# Patient Record
Sex: Male | Born: 1955
Health system: Southern US, Community
[De-identification: ages and names within clinical notes are randomized; demographics above are authoritative.]

## PROBLEM LIST (undated history)

## (undated) DIAGNOSIS — I10 Essential (primary) hypertension: Secondary | ICD-10-CM

## (undated) DIAGNOSIS — N529 Male erectile dysfunction, unspecified: Secondary | ICD-10-CM

## (undated) DIAGNOSIS — R5383 Other fatigue: Secondary | ICD-10-CM

## (undated) DIAGNOSIS — N189 Chronic kidney disease, unspecified: Secondary | ICD-10-CM

## (undated) DIAGNOSIS — K219 Gastro-esophageal reflux disease without esophagitis: Secondary | ICD-10-CM

## (undated) DIAGNOSIS — G4730 Sleep apnea, unspecified: Secondary | ICD-10-CM

## (undated) DIAGNOSIS — K635 Polyp of colon: Secondary | ICD-10-CM

## (undated) HISTORY — DX: Essential (primary) hypertension: I10

## (undated) HISTORY — DX: Polyp of colon: K63.5

## (undated) HISTORY — DX: Gastro-esophageal reflux disease without esophagitis: K21.9

## (undated) HISTORY — PX: TONSILLECTOMY: SUR1361

## (undated) HISTORY — PX: HERNIA REPAIR: SHX51

## (undated) HISTORY — DX: Male erectile dysfunction, unspecified: N52.9

## (undated) HISTORY — DX: Other fatigue: R53.83

## (undated) HISTORY — DX: Chronic kidney disease, unspecified: N18.9

---

## 2007-12-11 DIAGNOSIS — K635 Polyp of colon: Secondary | ICD-10-CM

## 2007-12-11 HISTORY — DX: Polyp of colon: K63.5

## 2011-02-09 ENCOUNTER — Ambulatory Visit (HOSPITAL_COMMUNITY)
Admission: RE | Admit: 2011-02-09 | Discharge: 2011-02-09 | Disposition: A | Discharge status: Home / Self Care | Payer: BC Managed Care – PPO | Source: Ambulatory Visit | Attending: Oncology | Admitting: Oncology

## 2011-02-09 ENCOUNTER — Other Ambulatory Visit: Payer: Self-pay | Admitting: Oncology

## 2011-02-09 ENCOUNTER — Encounter (HOSPITAL_BASED_OUTPATIENT_CLINIC_OR_DEPARTMENT_OTHER): Payer: BC Managed Care – PPO | Admitting: Oncology

## 2011-02-09 DIAGNOSIS — D696 Thrombocytopenia, unspecified: Secondary | ICD-10-CM | POA: Insufficient documentation

## 2011-02-09 DIAGNOSIS — R109 Unspecified abdominal pain: Secondary | ICD-10-CM | POA: Insufficient documentation

## 2011-02-09 DIAGNOSIS — D6959 Other secondary thrombocytopenia: Secondary | ICD-10-CM

## 2011-02-09 DIAGNOSIS — R143 Flatulence: Secondary | ICD-10-CM | POA: Insufficient documentation

## 2011-02-09 DIAGNOSIS — R141 Gas pain: Secondary | ICD-10-CM | POA: Insufficient documentation

## 2011-02-09 DIAGNOSIS — R142 Eructation: Secondary | ICD-10-CM | POA: Insufficient documentation

## 2011-02-09 LAB — CBC WITH DIFFERENTIAL/PLATELET
BASO%: 0.4 % (ref 0.0–2.0)
EOS%: 1.1 % (ref 0.0–7.0)
HGB: 14.7 g/dL (ref 13.0–17.1)
MCH: 30.6 pg (ref 27.2–33.4)
MCHC: 35.6 g/dL (ref 32.0–36.0)
MCV: 85.9 fL (ref 79.3–98.0)
MONO%: 7.3 % (ref 0.0–14.0)
RBC: 4.81 10*6/uL (ref 4.20–5.82)
RDW: 12.7 % (ref 11.0–14.6)
lymph#: 1.5 10*3/uL (ref 0.9–3.3)

## 2011-02-09 LAB — PROTIME-INR
INR: 1 — ABNORMAL LOW (ref 2.00–3.50)
Protime: 12 Seconds (ref 10.6–13.4)

## 2011-02-09 LAB — CHCC SMEAR

## 2011-02-10 LAB — COMPREHENSIVE METABOLIC PANEL
AST: 32 U/L (ref 0–37)
Albumin: 4.8 g/dL (ref 3.5–5.2)
Alkaline Phosphatase: 54 U/L (ref 39–117)
Glucose, Bld: 68 mg/dL — ABNORMAL LOW (ref 70–99)
Potassium: 4.4 mEq/L (ref 3.5–5.3)
Sodium: 140 mEq/L (ref 135–145)
Total Bilirubin: 0.7 mg/dL (ref 0.3–1.2)
Total Protein: 6.7 g/dL (ref 6.0–8.3)

## 2011-04-18 ENCOUNTER — Other Ambulatory Visit: Payer: Self-pay | Admitting: Oncology

## 2011-04-18 ENCOUNTER — Encounter (HOSPITAL_BASED_OUTPATIENT_CLINIC_OR_DEPARTMENT_OTHER): Payer: BC Managed Care – PPO | Admitting: Oncology

## 2011-04-18 DIAGNOSIS — R109 Unspecified abdominal pain: Secondary | ICD-10-CM

## 2011-04-18 DIAGNOSIS — D696 Thrombocytopenia, unspecified: Secondary | ICD-10-CM

## 2011-04-18 DIAGNOSIS — D6959 Other secondary thrombocytopenia: Secondary | ICD-10-CM

## 2011-04-18 LAB — CBC WITH DIFFERENTIAL/PLATELET
Eosinophils Absolute: 0.1 10*3/uL (ref 0.0–0.5)
HCT: 41.6 % (ref 38.4–49.9)
LYMPH%: 26.9 % (ref 14.0–49.0)
MCHC: 34.7 g/dL (ref 32.0–36.0)
MCV: 89.1 fL (ref 79.3–98.0)
MONO#: 0.4 10*3/uL (ref 0.1–0.9)
MONO%: 8 % (ref 0.0–14.0)
NEUT#: 3 10*3/uL (ref 1.5–6.5)
NEUT%: 63 % (ref 39.0–75.0)
Platelets: 124 10*3/uL — ABNORMAL LOW (ref 140–400)
RBC: 4.66 10*6/uL (ref 4.20–5.82)
WBC: 4.7 10*3/uL (ref 4.0–10.3)

## 2011-04-23 ENCOUNTER — Ambulatory Visit (HOSPITAL_COMMUNITY)
Admission: RE | Admit: 2011-04-23 | Discharge: 2011-04-23 | Disposition: A | Discharge status: Home / Self Care | Payer: BC Managed Care – PPO | Source: Ambulatory Visit | Attending: Oncology | Admitting: Oncology

## 2011-04-23 DIAGNOSIS — K7689 Other specified diseases of liver: Secondary | ICD-10-CM | POA: Insufficient documentation

## 2011-04-23 DIAGNOSIS — R109 Unspecified abdominal pain: Secondary | ICD-10-CM | POA: Insufficient documentation

## 2011-04-23 DIAGNOSIS — K4020 Bilateral inguinal hernia, without obstruction or gangrene, not specified as recurrent: Secondary | ICD-10-CM | POA: Insufficient documentation

## 2011-04-23 DIAGNOSIS — D696 Thrombocytopenia, unspecified: Secondary | ICD-10-CM

## 2011-04-23 MED ORDER — IOHEXOL 300 MG/ML  SOLN
100.0000 mL | Freq: Once | INTRAMUSCULAR | Status: AC | PRN
  Administered 2011-04-23: 100 mL via INTRAVENOUS

## 2011-09-19 ENCOUNTER — Encounter: Payer: Self-pay | Admitting: *Deleted

## 2011-10-17 ENCOUNTER — Encounter: Payer: Self-pay | Admitting: *Deleted

## 2011-10-27 ENCOUNTER — Telehealth: Payer: Self-pay | Admitting: Oncology

## 2011-10-27 NOTE — Telephone Encounter (Signed)
Lvm advising new appt 12/21/11 @ 10am r/s'd from 11/9 appt cx'd due to Epic. I have also mailed pt an appt calendar.

## 2011-12-21 ENCOUNTER — Ambulatory Visit: Payer: BC Managed Care – PPO | Admitting: Oncology

## 2011-12-21 ENCOUNTER — Other Ambulatory Visit: Payer: BC Managed Care – PPO

## 2012-11-17 ENCOUNTER — Emergency Department (INDEPENDENT_AMBULATORY_CARE_PROVIDER_SITE_OTHER): Payer: BC Managed Care – PPO

## 2012-11-17 ENCOUNTER — Emergency Department
Admission: EM | Admit: 2012-11-17 | Discharge: 2012-11-17 | Disposition: A | Discharge status: Home / Self Care | Payer: BC Managed Care – PPO | Source: Home / Self Care | Attending: Family Medicine | Admitting: Family Medicine

## 2012-11-17 ENCOUNTER — Encounter: Payer: Self-pay | Admitting: *Deleted

## 2012-11-17 DIAGNOSIS — R509 Fever, unspecified: Secondary | ICD-10-CM

## 2012-11-17 DIAGNOSIS — J029 Acute pharyngitis, unspecified: Secondary | ICD-10-CM

## 2012-11-17 DIAGNOSIS — R059 Cough, unspecified: Secondary | ICD-10-CM

## 2012-11-17 DIAGNOSIS — J069 Acute upper respiratory infection, unspecified: Secondary | ICD-10-CM

## 2012-11-17 DIAGNOSIS — R0602 Shortness of breath: Secondary | ICD-10-CM

## 2012-11-17 DIAGNOSIS — R05 Cough: Secondary | ICD-10-CM

## 2012-11-17 HISTORY — DX: Sleep apnea, unspecified: G47.30

## 2012-11-17 MED ORDER — AZITHROMYCIN 250 MG PO TABS: ORAL_TABLET | ORAL | Status: DC

## 2012-11-17 MED ORDER — OMEPRAZOLE 40 MG PO CPDR: 40.0000 mg | DELAYED_RELEASE_CAPSULE | Freq: Two times a day (BID) | ORAL | Status: AC

## 2012-11-17 MED ORDER — HYDROCOD POLST-CHLORPHEN POLST 10-8 MG/5ML PO LQCR: 5.0000 mL | Freq: Two times a day (BID) | ORAL | Status: DC | PRN

## 2012-11-17 NOTE — ED Notes (Signed)
Pt c/o sore throat and productive cough x 2 days. He has taken emergent C, ASA, theraflu, cough gtts, and Robt with no relief.

## 2012-11-24 ENCOUNTER — Telehealth: Payer: Self-pay | Admitting: *Deleted

## 2013-01-07 NOTE — ED Provider Notes (Signed)
History     CSN: [id number]  Arrival date & time 11/17/12  1101   First MD Initiated Contact with Patient 11/17/12 1111      Chief Complaint  Patient presents with  . Sore Throat  . Cough   HPI  URI Symptoms Onset: 2-3 days  Description: rhinorrhea, nasal congestion, cough  Modifying factors:  None   Symptoms Nasal discharge: yes Fever: mild Sore throat: mild Cough: yes Wheezing: no Ear pain: no GI symptoms: no Sick contacts: yes  Red Flags  Stiff neck: no Dyspnea: no Rash: no Swallowing difficulty: no  Sinusitis Risk Factors Headache/face pain: no Double sickening: no tooth pain: no  Allergy Risk Factors Sneezing: no Itchy scratchy throat: no Seasonal symptoms: no  Flu Risk Factors Headache: no muscle aches: no severe fatigue: no   Past Medical History  Diagnosis Date  . Secondary thrombocytopenia   . GERD (gastroesophageal reflux disease)   . Fatigue   . Erectile dysfunction   . Sleep apnea     Past Surgical History  Procedure Date  . Hernia repair     as a child  . Tonsillectomy     Family History  Problem Relation Age of Onset  . Cancer Mother     lung  . Heart failure Father     History  Substance Use Topics  . Smoking status: Never Smoker   . Smokeless tobacco: Not on file  . Alcohol Use: Yes      Review of Systems  All other systems reviewed and are negative.    Allergies  Review of patient's allergies indicates no known allergies.  Home Medications   Current Outpatient Rx  Name  Route  Sig  Dispense  Refill  . HYDROCHLOROTHIAZIDE 12.5 MG PO CAPS   Oral   Take 12.5 mg by mouth daily.         . AZITHROMYCIN 250 MG PO TABS      Take 2 tabs PO x 1 dose, then 1 tab PO QD x 4 days   6 tablet   0   . HYDROCOD POLST-CPM POLST ER 10-8 MG/5ML PO LQCR   Oral   Take 5 mLs by mouth every 12 (twelve) hours as needed (cough).   60 mL   0   . OMEPRAZOLE 40 MG PO CPDR   Oral   Take 1 capsule (40 mg total) by  mouth 2 (two) times daily.   60 capsule   2   . PRILOSEC OTC PO   Oral   Take by mouth as needed.             BP 124/81  Pulse 88  Temp 99.2 F (37.3 C) (Oral)  Resp 18  Ht 5' 10.5" (1.791 m)  Wt 215 lb (97.523 kg)  BMI 30.41 kg/m2  SpO2 96%  Physical Exam  Constitutional: He appears well-developed and well-nourished.  HENT:  Head: Normocephalic and atraumatic.  Right Ear: External ear normal.  Left Ear: External ear normal.       +nasal erythema, rhinorrhea bilaterally, + post oropharyngeal erythema    Eyes: Conjunctivae normal are normal. Pupils are equal, round, and reactive to light.  Neck: Normal range of motion. Neck supple.  Cardiovascular: Normal rate, regular rhythm and normal heart sounds.   Pulmonary/Chest: Effort normal and breath sounds normal.  Abdominal: Soft.  Musculoskeletal: Normal range of motion.  Neurological: He is alert.  Skin: Skin is warm.    ED Course  Procedures (including critical  care time)   Labs Reviewed  POCT RAPID STREP A (OFFICE)  CHEST - 2 VIEW  Comparison: None.  Findings: Cardiomediastinal silhouette appears normal. No acute  pulmonary disease is noted.  IMPRESSION:  No acute cardiopulmonary abnormality seen.   No results found.   1. URI (upper respiratory infection)       MDM  Rapid strep negative.  CXR negative for infiltrate.  Likely viral URI.  tussionex for cough.  Discussed supportive care and infectious red flags.  Zpak if sxs persist >7-10 days.  Follow up as needed.     The patient and/or caregiver has been counseled thoroughly with regard to treatment plan and/or medications prescribed including dosage, schedule, interactions, rationale for use, and possible side effects and they verbalize understanding. Diagnoses and expected course of recovery discussed and will return if not improved as expected or if the condition worsens. Patient and/or caregiver verbalized understanding.              Doree Albee, MD 01/07/13 1131

## 2013-04-03 ENCOUNTER — Other Ambulatory Visit: Payer: Self-pay | Admitting: Family Medicine

## 2014-11-30 ENCOUNTER — Emergency Department (INDEPENDENT_AMBULATORY_CARE_PROVIDER_SITE_OTHER): Payer: BC Managed Care – PPO

## 2014-11-30 ENCOUNTER — Other Ambulatory Visit: Payer: Self-pay | Admitting: Family Medicine

## 2014-11-30 ENCOUNTER — Emergency Department (INDEPENDENT_AMBULATORY_CARE_PROVIDER_SITE_OTHER)
Admission: EM | Admit: 2014-11-30 | Discharge: 2014-11-30 | Disposition: A | Discharge status: Home / Self Care | Payer: BC Managed Care – PPO | Source: Home / Self Care | Attending: Family Medicine | Admitting: Family Medicine

## 2014-11-30 ENCOUNTER — Encounter: Payer: Self-pay | Admitting: *Deleted

## 2014-11-30 DIAGNOSIS — R053 Chronic cough: Secondary | ICD-10-CM

## 2014-11-30 DIAGNOSIS — R059 Cough, unspecified: Secondary | ICD-10-CM

## 2014-11-30 DIAGNOSIS — R05 Cough: Secondary | ICD-10-CM

## 2014-11-30 MED ORDER — CLARITHROMYCIN 500 MG PO TABS: 500.0000 mg | ORAL_TABLET | Freq: Two times a day (BID) | ORAL | Status: DC

## 2014-11-30 MED ORDER — BENZONATATE 200 MG PO CAPS: 200.0000 mg | ORAL_CAPSULE | Freq: Every day | ORAL | Status: DC

## 2014-11-30 NOTE — Discharge Instructions (Signed)
Take plain Mucinex (1200 mg guaifenesin) twice daily for cough and congestion.  May add Sudafed for sinus congestion.   Increase fluid intake, rest. May use Afrin nasal spray (or generic oxymetazoline) twice daily for about 5 days.  Also recommend using saline nasal spray several times daily and saline nasal irrigation (AYR is a common brand) Try warm salt water gargles for sore throat.  Stop all antihistamines for now, and other non-prescription cough/cold preparations. May take Ibuprofen 200mg , 4 tabs every 8 hours with food for chest/sternum discomfort. Recommend a Tdap when well.  Follow-up with family doctor if not improving about one week .

## 2014-11-30 NOTE — ED Provider Notes (Signed)
CSN: [id number]     Arrival date & time 11/30/14  1016 History   First MD Initiated Contact with Patient 11/30/14 1053     Chief Complaint  Patient presents with  . Cough      HPI Comments: Patient reports a history of typical cold-like symptoms including mild sore throat, sinus congestion, headache, fatigue, and cough about 4 weeks ago.  He was treated with amoxicillin 875mg  BID for 10 days. Over the past two weeks his cough has increased, and is worse at night.  He often coughs until he gags.  Over the past two days he has had sweats, wheezing, and occasional shortness of breath with activity.  The history is provided by the patient.    Past Medical History  Diagnosis Date  . Secondary thrombocytopenia   . GERD (gastroesophageal reflux disease)   . Fatigue   . Erectile dysfunction   . Sleep apnea   . Colon polyp 2009  . Chronic kidney disease     STAGE 3  . Hypertension    Past Surgical History  Procedure Laterality Date  . Hernia repair      as a child  . Tonsillectomy     Family History  Problem Relation Age of Onset  . Cancer Mother     lung  . Heart failure Father    History  Substance Use Topics  . Smoking status: Never Smoker   . Smokeless tobacco: Never Used  . Alcohol Use: Yes    Review of Systems + sore throat + cough + sneezing No pleuritic pain + wheezing + nasal congestion + post-nasal drainage No sinus pain/pressure No itchy/red eyes No earache No hemoptysis + SOB No fever, + chills/sweats No nausea No vomiting No abdominal pain No diarrhea No urinary symptoms No skin rash + fatigue No myalgias No headache Used OTC meds without relief    Allergies  Review of patient's allergies indicates no known allergies.  Home Medications   Prior to Admission medications   Medication Sig Start Date End Date Taking? Authorizing Provider  omeprazole (PRILOSEC) 40 MG capsule Take 1 capsule (40 mg total) by mouth 2 (two) times daily. 11/17/12   Yes Doree AlbeeSteven Newton, MD  benzonatate (TESSALON) 200 MG capsule Take 1 capsule (200 mg total) by mouth at bedtime. Take as needed for cough 11/30/14   Lattie HawStephen A Kataya Guimont, MD  clarithromycin (BIAXIN) 500 MG tablet Take 1 tablet (500 mg total) by mouth 2 (two) times daily. Take with food. 11/30/14   Lattie HawStephen A Jaimon Bugaj, MD  zolpidem (AMBIEN) 10 MG tablet Take 10 mg by mouth at bedtime as needed for sleep.    Historical Provider, MD   BP 143/90 mmHg  Pulse 53  Temp(Src) 97.7 F (36.5 C) (Oral)  Resp 16  Wt 222 lb (100.699 kg)  SpO2 97% Physical Exam Nursing notes and Vital Signs reviewed. Appearance:  Patient appears stated age, and in no acute distress Eyes:  Pupils are equal, round, and reactive to light and accomodation.  Extraocular movement is intact.  Conjunctivae are not inflamed  Ears:  Canals normal.  Tympanic membranes normal.  Nose:  Mildly congested turbinates.  No sinus tenderness.   Pharynx:  Normal Neck:  Supple.  Slightly enlarged tender posterior nodes are palpated bilaterally  Lungs:  Clear to auscultation.  Breath sounds are equal.  Heart:  Regular rate and rhythm without murmurs, rubs, or gallops.  Abdomen:  Nontender without masses or hepatosplenomegaly.  Bowel sounds are present.  No  CVA or flank tenderness.  Extremities:  No edema.  No calf tenderness Skin:  No rash present.   ED Course  Procedures none    Imaging Review Dg Chest 2 View  11/30/2014   CLINICAL DATA:  58 year old male with productive cough, chest congestion for 4 weeks. Initial encounter.  EXAM: CHEST  2 VIEW  COMPARISON:  11/17/2012 and earlier.  FINDINGS: Lung volumes are stable and within normal limits. Cardiac size is stable at the upper limits of normal. Mildly tortuous thoracic aorta. Other mediastinal contours are within normal limits. Visualized tracheal air column is within normal limits. No pneumothorax, pulmonary edema, pleural effusion or confluent pulmonary opacity. Incidental chronic right  anterior second and third rib deformities with pseudarthrosis. No acute osseous abnormality identified.  IMPRESSION: No acute cardiopulmonary abnormality.   Electronically Signed   By: Augusto GambleLee  Hall M.D.   On: 11/30/2014 11:34     MDM   1. Persistent cough for 3 weeks or longer; ?pertussis    Pertussis culture and PCR pending.  Begin Biaxin.  Prescription written for Benzonatate [facility](Tessalon) to take at bedtime for night-time cough.  Advised to decrease dose of Ambien while taking Biaxin. Take plain Mucinex (1200 mg guaifenesin) twice daily for cough and congestion.  May add Sudafed for sinus congestion.   Increase fluid intake, rest. May use Afrin nasal spray (or generic oxymetazoline) twice daily for about 5 days.  Also recommend using saline nasal spray several times daily and saline nasal irrigation (AYR is a common brand) Try warm salt water gargles for sore throat.  Stop all antihistamines for now, and other non-prescription cough/cold preparations. May take Ibuprofen 200mg , 4 tabs every 8 hours with food for chest/sternum discomfort. Recommend a Tdap when well.  Follow-up with family doctor if not improving about one week .    Lattie HawStephen A Lavan Imes, MD 12/06/14 [phone]

## 2014-11-30 NOTE — ED Notes (Signed)
Eric StallionGeorge c/o cough and congestion since 11/09/14. Started 10 days of amoxicillin 11/11/14. SOB with activity.

## 2014-12-02 LAB — BORDETELLA PERTUSSIS PCR
B PARAPERTUSSIS, DNA: NOT DETECTED
B pertussis, DNA: NOT DETECTED

## 2014-12-10 ENCOUNTER — Telehealth: Payer: Self-pay | Admitting: *Deleted

## 2014-12-15 LAB — CULTURE, BORDETELLA PERTUSSIS

## 2015-04-10 ENCOUNTER — Emergency Department (INDEPENDENT_AMBULATORY_CARE_PROVIDER_SITE_OTHER)
Admission: EM | Admit: 2015-04-10 | Discharge: 2015-04-10 | Disposition: A | Discharge status: Home / Self Care | Payer: BLUE CROSS/BLUE SHIELD | Source: Home / Self Care | Attending: Emergency Medicine | Admitting: Emergency Medicine

## 2015-04-10 ENCOUNTER — Encounter: Payer: Self-pay | Admitting: Emergency Medicine

## 2015-04-10 DIAGNOSIS — M6740 Ganglion, unspecified site: Secondary | ICD-10-CM | POA: Diagnosis not present

## 2015-04-10 DIAGNOSIS — M67479 Ganglion, unspecified ankle and foot: Secondary | ICD-10-CM

## 2015-04-10 MED ORDER — SULFAMETHOXAZOLE-TRIMETHOPRIM 800-160 MG PO TABS: 1.0000 | ORAL_TABLET | Freq: Two times a day (BID) | ORAL | Status: AC

## 2015-04-10 MED ORDER — ZOLPIDEM TARTRATE 10 MG PO TABS: 10.0000 mg | ORAL_TABLET | Freq: Every evening | ORAL | Status: AC | PRN

## 2015-04-10 NOTE — ED Provider Notes (Signed)
CSN: [id number]     Arrival date & time 04/10/15  1354 History   First MD Initiated Contact with Patient 04/10/15 1459     Chief Complaint  Patient presents with  . Cyst   (Consider location/radiation/quality/duration/timing/severity/associated sxs/prior Treatment) Patient is a 59 y.o. male presenting with toe pain. The history is provided by the patient. No language interpreter was used.  Toe Pain This is a recurrent problem. The current episode started more than 1 year ago. The problem occurs constantly. The problem has been gradually worsening. Associated symptoms include joint swelling. Nothing aggravates the symptoms. He has tried nothing for the symptoms. The treatment provided no relief.  Pt has a cyst on top of his 3rd toe. Pt reports he has had for a while.  Pt wants area drained due to discomfort in shoes.  Past Medical History  Diagnosis Date  . Secondary thrombocytopenia   . GERD (gastroesophageal reflux disease)   . Fatigue   . Erectile dysfunction   . Sleep apnea   . Colon polyp 2009  . Chronic kidney disease     STAGE 3  . Hypertension    Past Surgical History  Procedure Laterality Date  . Hernia repair      as a child  . Tonsillectomy     Family History  Problem Relation Age of Onset  . Cancer Mother     lung  . Heart failure Father    History  Substance Use Topics  . Smoking status: Never Smoker   . Smokeless tobacco: Never Used  . Alcohol Use: Yes    Review of Systems  Musculoskeletal: Positive for joint swelling.  All other systems reviewed and are negative.   Allergies  Review of patient's allergies indicates no known allergies.  Home Medications   Prior to Admission medications   Medication Sig Start Date End Date Taking? Authorizing Provider  benzonatate (TESSALON) 200 MG capsule Take 1 capsule (200 mg total) by mouth at bedtime. Take as needed for cough 11/30/14   Lattie HawStephen A Beese, MD  clarithromycin (BIAXIN) 500 MG tablet Take 1 tablet  (500 mg total) by mouth 2 (two) times daily. Take with food. 11/30/14   Lattie HawStephen A Beese, MD  omeprazole (PRILOSEC) 40 MG capsule Take 1 capsule (40 mg total) by mouth 2 (two) times daily. 11/17/12   Floydene FlockSteven J Newton, MD  zolpidem (AMBIEN) 10 MG tablet Take 10 mg by mouth at bedtime as needed for sleep.    Historical Provider, MD   BP 109/72 mmHg  Pulse 56  Temp(Src) 97.7 F (36.5 C) (Oral)  Resp 16  Ht 5' 10.5" (1.791 m)  Wt 223 lb (101.152 kg)  BMI 31.53 kg/m2  SpO2 98% Physical Exam  Constitutional: He appears well-developed.  Musculoskeletal: He exhibits tenderness.  3mm round fluid filled cyst 3rd toe,  nv and ns intact  Neurological: He is alert.  Skin: Skin is warm.  Vitals reviewed.   ED Course  INCISION AND DRAINAGE Date/Time: 04/11/2015 9:04 AM Performed by: Elson AreasSOFIA, Celinda Dethlefs K Authorized by: Elson AreasSOFIA, Shanedra Lave K Consent: Verbal consent obtained. Risks and benefits: risks, benefits and alternatives were discussed Consent given by: patient Patient understanding: patient states understanding of the procedure being performed Patient identity confirmed: verbally with patient Type: cyst Body area: lower extremity Location details: right third toe Anesthesia: local infiltration Patient sedated: no Needle gauge: 22 Incision type: single straight Drainage: serous Drainage amount: scant Patient tolerance: Patient tolerated the procedure well with no immediate complications   (  including critical care time) Labs Review Labs Reviewed - No data to display  Imaging Review No results found.   MDM   1. Mucous cyst of toe    AVS Pt given rx for bactrim Follow up with Dr. Charlsie Merles if symptoms persist.    Lonia Skinner [place], PA-C 04/11/15 [phone]

## 2015-04-10 NOTE — Discharge Instructions (Signed)

## 2015-04-10 NOTE — ED Notes (Signed)
Patient has had cyst on middle toe of left foot for years without problems; now has gotten somewhat larger and is sore with pressure of shoes.

## 2016-04-03 DIAGNOSIS — G4733 Obstructive sleep apnea (adult) (pediatric): Secondary | ICD-10-CM | POA: Diagnosis not present

## 2016-06-01 DIAGNOSIS — X500XXA Overexertion from strenuous movement or load, initial encounter: Secondary | ICD-10-CM | POA: Diagnosis not present

## 2016-06-01 DIAGNOSIS — S96911A Strain of unspecified muscle and tendon at ankle and foot level, right foot, initial encounter: Secondary | ICD-10-CM | POA: Diagnosis not present

## 2016-06-01 DIAGNOSIS — R2242 Localized swelling, mass and lump, left lower limb: Secondary | ICD-10-CM | POA: Diagnosis not present

## 2016-06-01 DIAGNOSIS — M79672 Pain in left foot: Secondary | ICD-10-CM | POA: Diagnosis not present

## 2016-07-05 DIAGNOSIS — G4733 Obstructive sleep apnea (adult) (pediatric): Secondary | ICD-10-CM | POA: Diagnosis not present

## 2016-08-02 DIAGNOSIS — Z79899 Other long term (current) drug therapy: Secondary | ICD-10-CM | POA: Diagnosis not present

## 2016-08-02 DIAGNOSIS — Z125 Encounter for screening for malignant neoplasm of prostate: Secondary | ICD-10-CM | POA: Diagnosis not present

## 2016-08-02 DIAGNOSIS — Z0000 Encounter for general adult medical examination without abnormal findings: Secondary | ICD-10-CM | POA: Diagnosis not present

## 2016-08-02 DIAGNOSIS — I10 Essential (primary) hypertension: Secondary | ICD-10-CM | POA: Diagnosis not present

## 2016-10-08 DIAGNOSIS — G4733 Obstructive sleep apnea (adult) (pediatric): Secondary | ICD-10-CM | POA: Diagnosis not present

## 2017-04-03 DIAGNOSIS — M25561 Pain in right knee: Secondary | ICD-10-CM | POA: Diagnosis not present

## 2017-04-03 DIAGNOSIS — M1711 Unilateral primary osteoarthritis, right knee: Secondary | ICD-10-CM | POA: Diagnosis not present

## 2017-05-03 DIAGNOSIS — M1711 Unilateral primary osteoarthritis, right knee: Secondary | ICD-10-CM | POA: Diagnosis not present

## 2017-08-08 DIAGNOSIS — Z79899 Other long term (current) drug therapy: Secondary | ICD-10-CM | POA: Diagnosis not present

## 2017-08-08 DIAGNOSIS — D696 Thrombocytopenia, unspecified: Secondary | ICD-10-CM | POA: Diagnosis not present

## 2017-08-08 DIAGNOSIS — Z23 Encounter for immunization: Secondary | ICD-10-CM | POA: Diagnosis not present

## 2017-08-08 DIAGNOSIS — Z0000 Encounter for general adult medical examination without abnormal findings: Secondary | ICD-10-CM | POA: Diagnosis not present

## 2017-08-08 DIAGNOSIS — I10 Essential (primary) hypertension: Secondary | ICD-10-CM | POA: Diagnosis not present

## 2017-08-22 DIAGNOSIS — N200 Calculus of kidney: Secondary | ICD-10-CM | POA: Diagnosis not present

## 2017-08-22 DIAGNOSIS — R1031 Right lower quadrant pain: Secondary | ICD-10-CM | POA: Diagnosis not present

## 2017-08-22 DIAGNOSIS — K5730 Diverticulosis of large intestine without perforation or abscess without bleeding: Secondary | ICD-10-CM | POA: Diagnosis not present

## 2017-08-22 DIAGNOSIS — K4020 Bilateral inguinal hernia, without obstruction or gangrene, not specified as recurrent: Secondary | ICD-10-CM | POA: Diagnosis not present

## 2017-08-22 DIAGNOSIS — K7689 Other specified diseases of liver: Secondary | ICD-10-CM | POA: Diagnosis not present

## 2017-08-22 DIAGNOSIS — R1032 Left lower quadrant pain: Secondary | ICD-10-CM | POA: Diagnosis not present

## 2017-08-22 DIAGNOSIS — Z86010 Personal history of colonic polyps: Secondary | ICD-10-CM | POA: Diagnosis not present

## 2017-08-22 DIAGNOSIS — G8929 Other chronic pain: Secondary | ICD-10-CM | POA: Diagnosis not present

## 2017-08-22 DIAGNOSIS — K5900 Constipation, unspecified: Secondary | ICD-10-CM | POA: Diagnosis not present

## 2017-08-23 DIAGNOSIS — G8929 Other chronic pain: Secondary | ICD-10-CM | POA: Diagnosis not present

## 2017-08-23 DIAGNOSIS — R1032 Left lower quadrant pain: Secondary | ICD-10-CM | POA: Diagnosis not present

## 2017-08-23 DIAGNOSIS — R1031 Right lower quadrant pain: Secondary | ICD-10-CM | POA: Diagnosis not present

## 2017-08-27 DIAGNOSIS — N200 Calculus of kidney: Secondary | ICD-10-CM | POA: Diagnosis not present

## 2017-09-11 DIAGNOSIS — R1030 Lower abdominal pain, unspecified: Secondary | ICD-10-CM | POA: Diagnosis not present

## 2017-09-11 DIAGNOSIS — Z86010 Personal history of colonic polyps: Secondary | ICD-10-CM | POA: Diagnosis not present

## 2017-09-12 DIAGNOSIS — N200 Calculus of kidney: Secondary | ICD-10-CM | POA: Diagnosis not present

## 2017-09-12 DIAGNOSIS — K7689 Other specified diseases of liver: Secondary | ICD-10-CM | POA: Diagnosis not present

## 2017-09-12 DIAGNOSIS — K5790 Diverticulosis of intestine, part unspecified, without perforation or abscess without bleeding: Secondary | ICD-10-CM | POA: Diagnosis not present

## 2017-09-12 DIAGNOSIS — K4020 Bilateral inguinal hernia, without obstruction or gangrene, not specified as recurrent: Secondary | ICD-10-CM | POA: Diagnosis not present

## 2017-09-24 DIAGNOSIS — N401 Enlarged prostate with lower urinary tract symptoms: Secondary | ICD-10-CM | POA: Diagnosis not present

## 2017-09-24 DIAGNOSIS — N200 Calculus of kidney: Secondary | ICD-10-CM | POA: Diagnosis not present

## 2017-09-24 DIAGNOSIS — R3912 Poor urinary stream: Secondary | ICD-10-CM | POA: Diagnosis not present

## 2018-01-06 DIAGNOSIS — G4733 Obstructive sleep apnea (adult) (pediatric): Secondary | ICD-10-CM | POA: Diagnosis not present

## 2018-01-20 DIAGNOSIS — G4733 Obstructive sleep apnea (adult) (pediatric): Secondary | ICD-10-CM | POA: Diagnosis not present

## 2018-02-17 DIAGNOSIS — G4733 Obstructive sleep apnea (adult) (pediatric): Secondary | ICD-10-CM | POA: Diagnosis not present

## 2018-03-20 DIAGNOSIS — G4733 Obstructive sleep apnea (adult) (pediatric): Secondary | ICD-10-CM | POA: Diagnosis not present

## 2018-04-19 DIAGNOSIS — G4733 Obstructive sleep apnea (adult) (pediatric): Secondary | ICD-10-CM | POA: Diagnosis not present

## 2018-05-20 DIAGNOSIS — G4733 Obstructive sleep apnea (adult) (pediatric): Secondary | ICD-10-CM | POA: Diagnosis not present

## 2018-06-19 DIAGNOSIS — G4733 Obstructive sleep apnea (adult) (pediatric): Secondary | ICD-10-CM | POA: Diagnosis not present

## 2018-07-20 DIAGNOSIS — G4733 Obstructive sleep apnea (adult) (pediatric): Secondary | ICD-10-CM | POA: Diagnosis not present

## 2018-08-12 DIAGNOSIS — I10 Essential (primary) hypertension: Secondary | ICD-10-CM | POA: Diagnosis not present

## 2018-08-12 DIAGNOSIS — K219 Gastro-esophageal reflux disease without esophagitis: Secondary | ICD-10-CM | POA: Diagnosis not present

## 2018-08-12 DIAGNOSIS — Z23 Encounter for immunization: Secondary | ICD-10-CM | POA: Diagnosis not present

## 2018-08-12 DIAGNOSIS — Z79899 Other long term (current) drug therapy: Secondary | ICD-10-CM | POA: Diagnosis not present

## 2018-08-12 DIAGNOSIS — Z125 Encounter for screening for malignant neoplasm of prostate: Secondary | ICD-10-CM | POA: Diagnosis not present

## 2018-08-12 DIAGNOSIS — Z0000 Encounter for general adult medical examination without abnormal findings: Secondary | ICD-10-CM | POA: Diagnosis not present

## 2018-08-12 DIAGNOSIS — D696 Thrombocytopenia, unspecified: Secondary | ICD-10-CM | POA: Diagnosis not present

## 2018-08-20 DIAGNOSIS — G4733 Obstructive sleep apnea (adult) (pediatric): Secondary | ICD-10-CM | POA: Diagnosis not present

## 2018-09-22 DIAGNOSIS — G4733 Obstructive sleep apnea (adult) (pediatric): Secondary | ICD-10-CM | POA: Diagnosis not present

## 2018-11-11 DIAGNOSIS — I10 Essential (primary) hypertension: Secondary | ICD-10-CM | POA: Diagnosis not present

## 2018-12-16 DIAGNOSIS — I10 Essential (primary) hypertension: Secondary | ICD-10-CM | POA: Diagnosis not present

## 2018-12-16 DIAGNOSIS — D696 Thrombocytopenia, unspecified: Secondary | ICD-10-CM | POA: Diagnosis not present

## 2018-12-16 DIAGNOSIS — Z79899 Other long term (current) drug therapy: Secondary | ICD-10-CM | POA: Diagnosis not present

## 2019-01-15 ENCOUNTER — Emergency Department (INDEPENDENT_AMBULATORY_CARE_PROVIDER_SITE_OTHER)
Admission: EM | Admit: 2019-01-15 | Discharge: 2019-01-15 | Disposition: A | Discharge status: Home / Self Care | Payer: BLUE CROSS/BLUE SHIELD | Source: Home / Self Care | Attending: Family Medicine | Admitting: Family Medicine

## 2019-01-15 ENCOUNTER — Other Ambulatory Visit: Payer: Self-pay

## 2019-01-15 DIAGNOSIS — B9789 Other viral agents as the cause of diseases classified elsewhere: Secondary | ICD-10-CM

## 2019-01-15 DIAGNOSIS — J069 Acute upper respiratory infection, unspecified: Secondary | ICD-10-CM

## 2019-01-15 MED ORDER — AZITHROMYCIN 250 MG PO TABS: ORAL_TABLET | ORAL | 0 refills | Status: DC

## 2019-01-15 NOTE — ED Triage Notes (Signed)
Pt c/o sinus congestion and sore throat since Sunday night. Taking tylenol cold and flu prn. Denies fever.

## 2019-01-15 NOTE — Discharge Instructions (Addendum)
Take plain guaifenesin (1200mg extended release tabs such as Mucinex) twice daily, with plenty of water, for cough and congestion.  Get adequate rest.   °May use Afrin nasal spray (or generic oxymetazoline) each morning for about 5 days and then discontinue.  Also recommend using saline nasal spray several times daily and saline nasal irrigation (AYR is a common brand).  Use Flonase nasal spray each morning after using Afrin nasal spray and saline nasal irrigation. °Try warm salt water gargles for sore throat.  °Stop all antihistamines for now, and other non-prescription cough/cold preparations. °May take Delsym Cough Suppressant at bedtime for nighttime cough.  °Begin Azithromycin if not improving about one week or if persistent fever develops   °

## 2019-01-15 NOTE — ED Provider Notes (Signed)
[facility]    CSN: [id number] Arrival date & time: 01/15/19  0915     History   Chief Complaint Chief Complaint  Patient presents with  . Nasal Congestion  . Sore Throat    HPI Eric Gentry is a 63 y.o. male.   Patient complains of four day history of typical cold-like symptoms developing over several days, including mild sore throat, sinus congestion, fatigue, and cough.  He had daily chills/sweats that resolved yesterday.  He denies pleuritic pain or shortness of breath.  The history is provided by the patient.    Past Medical History:  Diagnosis Date  . Chronic kidney disease    STAGE 3  . Colon polyp 2009  . Erectile dysfunction   . Fatigue   . GERD (gastroesophageal reflux disease)   . Hypertension   . Secondary thrombocytopenia   . Sleep apnea     There are no active problems to display for this patient.   Past Surgical History:  Procedure Laterality Date  . HERNIA REPAIR     as a child  . TONSILLECTOMY         Home Medications    Prior to Admission medications   Medication Sig Start Date End Date Taking? Authorizing Provider  azithromycin (ZITHROMAX Z-PAK) 250 MG tablet Take 2 tabs today; then begin one tab once daily for 4 more days. (Rx void after 01/24/28) 01/15/19   Lattie Haw, MD  omeprazole (PRILOSEC) 40 MG capsule Take 1 capsule (40 mg total) by mouth 2 (two) times daily. 11/17/12   Floydene Flock, MD  zolpidem (AMBIEN) 10 MG tablet Take 1 tablet (10 mg total) by mouth at bedtime as needed for sleep. 04/10/15   Elson Areas, PA-C    Family History Family History  Problem Relation Age of Onset  . Cancer Mother        lung  . Heart failure Father     Social History Social History   Tobacco Use  . Smoking status: Never Smoker  . Smokeless tobacco: Never Used  Substance Use Topics  . Alcohol use: Yes  . Drug use: No     Allergies   Patient has no known allergies.   Review of Systems Review of  Systems + sore throat + cough No pleuritic pain No wheezing + nasal congestion + post-nasal drainage No sinus pain/pressure No itchy/red eyes No earache No hemoptysis No SOB ? fever, + chills/sweats No nausea No vomiting No abdominal pain No diarrhea No urinary symptoms No skin rash + fatigue No myalgias No headache Used OTC meds without relief   Physical Exam Triage Vital Signs ED Triage Vitals  Enc Vitals Group     BP 01/15/19 0926 (!) 168/96     Pulse Rate 01/15/19 0926 61     Resp 01/15/19 0926 18     Temp 01/15/19 0926 98.1 F (36.7 C)     Temp Source 01/15/19 0926 Oral     SpO2 01/15/19 0926 97 %     Weight 01/15/19 0927 235 lb (106.6 kg)     Height 01/15/19 0927 5\' 10"  (1.778 m)     Head Circumference --      Peak Flow --      Pain Score 01/15/19 0926 0     Pain Loc --      Pain Edu? --      Excl. in GC? --    No data found.  Updated Vital Signs BP Marland Kitchen)  168/96 (BP Location: Right Arm)   Pulse 61   Temp 98.1 F (36.7 C) (Oral)   Resp 18   Ht 5\' 10"  (1.778 m)   Wt 106.6 kg   SpO2 97%   BMI 33.72 kg/m   Visual Acuity Right Eye Distance:   Left Eye Distance:   Bilateral Distance:    Right Eye Near:   Left Eye Near:    Bilateral Near:     Physical Exam Nursing notes and Vital Signs reviewed. Appearance:  Patient appears stated age, and in no acute distress Eyes:  Pupils are equal, round, and reactive to light and accomodation.  Extraocular movement is intact.  Conjunctivae are not inflamed  Ears:  Canals normal.  Tympanic membranes normal.  Nose:  Mildly congested turbinates.  No sinus tenderness.   Pharynx:  Normal Neck:  Supple.  Enlarged posterior/lateral nodes are palpated bilaterally, tender to palpation on the left.   Lungs:  Clear to auscultation.  Breath sounds are equal.  Moving air well. Heart:  Regular rate and rhythm without murmurs, rubs, or gallops.  Abdomen:  Nontender without masses or hepatosplenomegaly.  Bowel sounds are  present.  No CVA or flank tenderness.  Extremities:  No edema.  Skin:  No rash present.    UC Treatments / Results  Labs (all labs ordered are listed, but only abnormal results are displayed) Labs Reviewed - No data to display  EKG None  Radiology No results found.  Procedures Procedures (including critical care time)  Medications Ordered in UC Medications - No data to display  Initial Impression / Assessment and Plan / UC Course  I have reviewed the triage vital signs and the nursing notes.  Pertinent labs & imaging results that were available during my care of the patient were reviewed by me and considered in my medical decision making (see chart for details).    There is no evidence of bacterial infection today.  Treat symptomatically for now. Followup with Family Doctor if not improved in about 10 days.   Final Clinical Impressions(s) / UC Diagnoses   Final diagnoses:  Viral URI with cough     Discharge Instructions     Take plain guaifenesin (1200mg  extended release tabs such as Mucinex) twice daily, with plenty of water, for cough and congestion.  Get adequate rest.   May use Afrin nasal spray (or generic oxymetazoline) each morning for about 5 days and then discontinue.  Also recommend using saline nasal spray several times daily and saline nasal irrigation (AYR is a common brand).  Use Flonase nasal spray each morning after using Afrin nasal spray and saline nasal irrigation. Try warm salt water gargles for sore throat.  Stop all antihistamines for now, and other non-prescription cough/cold preparations. May take Delsym Cough Suppressant at bedtime for nighttime cough.  Begin Azithromycin if not improving about one week or if persistent fever develops (Given a prescription to hold, with an expiration date)       ED Prescriptions    Medication Sig Dispense Auth. Provider   azithromycin (ZITHROMAX Z-PAK) 250 MG tablet Take 2 tabs today; then begin one tab once  daily for 4 more days. (Rx void after 01/24/28) 6 tablet Lattie HawBeese, Stephen A, MD         Lattie HawBeese, Stephen A, MD 01/15/19 [phone]

## 2019-01-28 DIAGNOSIS — B351 Tinea unguium: Secondary | ICD-10-CM | POA: Diagnosis not present

## 2019-01-28 DIAGNOSIS — L600 Ingrowing nail: Secondary | ICD-10-CM | POA: Diagnosis not present

## 2019-01-28 DIAGNOSIS — M79675 Pain in left toe(s): Secondary | ICD-10-CM | POA: Diagnosis not present

## 2019-02-04 DIAGNOSIS — G4733 Obstructive sleep apnea (adult) (pediatric): Secondary | ICD-10-CM | POA: Diagnosis not present

## 2019-08-12 DIAGNOSIS — G4733 Obstructive sleep apnea (adult) (pediatric): Secondary | ICD-10-CM | POA: Diagnosis not present

## 2019-08-27 DIAGNOSIS — Z23 Encounter for immunization: Secondary | ICD-10-CM | POA: Diagnosis not present

## 2021-11-14 ENCOUNTER — Encounter: Payer: Self-pay | Admitting: Emergency Medicine

## 2021-11-14 ENCOUNTER — Other Ambulatory Visit: Payer: Self-pay

## 2021-11-14 ENCOUNTER — Emergency Department (INDEPENDENT_AMBULATORY_CARE_PROVIDER_SITE_OTHER)
Admission: EM | Admit: 2021-11-14 | Discharge: 2021-11-14 | Disposition: A | Discharge status: Home / Self Care | Payer: BLUE CROSS/BLUE SHIELD | Source: Home / Self Care

## 2021-11-14 DIAGNOSIS — J019 Acute sinusitis, unspecified: Secondary | ICD-10-CM

## 2021-11-14 MED ORDER — PREDNISONE 20 MG PO TABS: 20.0000 mg | ORAL_TABLET | Freq: Two times a day (BID) | ORAL | 0 refills | Status: AC

## 2021-11-14 MED ORDER — AZITHROMYCIN 250 MG PO TABS: 250.0000 mg | ORAL_TABLET | Freq: Every day | ORAL | 0 refills | Status: AC

## 2021-11-14 NOTE — Discharge Instructions (Signed)
Take the z pak as directed Take the prednisone 2 x a day for 5 days May take a sudafed to sleep Make sure your CPAP is humidified

## 2021-11-14 NOTE — ED Provider Notes (Signed)
[facility]    CSN: [id number] Arrival date & time: 11/14/21  1538      History   Chief Complaint Chief Complaint  Patient presents with   Facial Pain   Nasal Congestion    HPI Eric Gentry is a 65 y.o. male.   HPI Patient has an upper respiratory infection.  Is been going on for 5 days.  He does not have a fever.  He does have sinus congestion, postnasal drip, sore throat, mild cough.  A lot of head congestion.  A lot of nasal congestion.  He states the nasal congestion is making it difficult to impossible to sleep.  Especially with his CPAP.  He took 1 Sudafed and this improved his breathing but he is afraid to take this given his hypertension.  Past Medical History:  Diagnosis Date   Chronic kidney disease    STAGE 3   Colon polyp 2009   Erectile dysfunction    Fatigue    GERD (gastroesophageal reflux disease)    Hypertension    Secondary thrombocytopenia    Sleep apnea     There are no problems to display for this patient.   Past Surgical History:  Procedure Laterality Date   HERNIA REPAIR     as a child   TONSILLECTOMY         Home Medications    Prior to Admission medications   Medication Sig Start Date End Date Taking? Authorizing Provider  amLODipine (NORVASC) 5 MG tablet Take 5 mg by mouth daily. 11/12/21  Yes [provider]  azithromycin (ZITHROMAX) 250 MG tablet Take 1 tablet (250 mg total) by mouth daily. Take first 2 tablets together, then 1 every day until finished. 11/14/21  Yes Eustace Moore, MD  lisinopril (ZESTRIL) 10 MG tablet Take 10 mg by mouth daily. 09/11/21  Yes [provider]  omeprazole (PRILOSEC) 40 MG capsule Take 1 capsule (40 mg total) by mouth 2 (two) times daily. 11/17/12  Yes Floydene Flock, MD  predniSONE (DELTASONE) 20 MG tablet Take 1 tablet (20 mg total) by mouth 2 (two) times daily with a meal. 11/14/21  Yes Eustace Moore, MD  zolpidem (AMBIEN) 10 MG tablet Take 1 tablet (10 mg  total) by mouth at bedtime as needed for sleep. 04/10/15   Elson Areas, PA-C    Family History Family History  Problem Relation Age of Onset   Cancer Mother        lung   Heart failure Father     Social History Social History   Tobacco Use   Smoking status: Never   Smokeless tobacco: Never  Vaping Use   Vaping Use: Never used  Substance Use Topics   Alcohol use: Yes   Drug use: No     Allergies   Patient has no known allergies.   Review of Systems Review of Systems See hpi  Physical Exam Triage Vital Signs ED Triage Vitals  Enc Vitals Group     BP 11/14/21 1621 (!) 143/86     Pulse Rate 11/14/21 1621 61     Resp 11/14/21 1621 16     Temp 11/14/21 1621 99 F (37.2 C)     Temp Source 11/14/21 1621 Oral     SpO2 11/14/21 1621 96 %     Weight --      Height --      Head Circumference --      Peak Flow --  Pain Score 11/14/21 1618 0     Pain Loc --      Pain Edu? --      Excl. in GC? --    No data found.  Updated Vital Signs BP (!) 143/86 (BP Location: Right Arm)   Pulse 61   Temp 99 F (37.2 C) (Oral)   Resp 16   SpO2 96%   Physical Exam Constitutional:      General: He is not in acute distress.    Appearance: He is well-developed.  HENT:     Head: Normocephalic and atraumatic.     Right Ear: Tympanic membrane and ear canal normal.     Left Ear: Tympanic membrane and ear canal normal.     Nose: Congestion and rhinorrhea present.     Mouth/Throat:     Pharynx: No posterior oropharyngeal erythema.  Eyes:     Conjunctiva/sclera: Conjunctivae normal.     Pupils: Pupils are equal, round, and reactive to light.  Cardiovascular:     Rate and Rhythm: Normal rate and regular rhythm.     Heart sounds: Normal heart sounds.  Pulmonary:     Effort: Pulmonary effort is normal. No respiratory distress.     Breath sounds: Normal breath sounds.  Abdominal:     General: There is no distension.     Palpations: Abdomen is soft.  Musculoskeletal:         General: Normal range of motion.     Cervical back: Normal range of motion.  Lymphadenopathy:     Cervical: Cervical adenopathy present.  Skin:    General: Skin is warm and dry.  Neurological:     Mental Status: He is alert.  Psychiatric:        Mood and Affect: Mood normal.        Behavior: Behavior normal.     UC Treatments / Results  Labs (all labs ordered are listed, but only abnormal results are displayed) Labs Reviewed - No data to display  EKG   Radiology No results found.  Procedures Procedures (including critical care time)  Medications Ordered in UC Medications - No data to display  Initial Impression / Assessment and Plan / UC Course  I have reviewed the triage vital signs and the nursing notes.  Pertinent labs & imaging results that were available during my care of the patient were reviewed by me and considered in my medical decision making (see chart for details).     Final Clinical Impressions(s) / UC Diagnoses   Final diagnoses:  Acute non-recurrent sinusitis, unspecified location     Discharge Instructions      Take the z pak as directed Take the prednisone 2 x a day for 5 days May take a sudafed to sleep Make sure your CPAP is humidified    ED Prescriptions     Medication Sig Dispense Auth. Provider   azithromycin (ZITHROMAX) 250 MG tablet Take 1 tablet (250 mg total) by mouth daily. Take first 2 tablets together, then 1 every day until finished. 6 tablet Eustace Moore, MD   predniSONE (DELTASONE) 20 MG tablet Take 1 tablet (20 mg total) by mouth 2 (two) times daily with a meal. 10 tablet Eustace Moore, MD      PDMP not reviewed this encounter.   Eustace Moore, MD 11/14/21 1714

## 2021-11-14 NOTE — ED Triage Notes (Signed)
Patient presents to Urgent Care with complaints of sinus pressure, headache, cough, sore throat since 5 days ago. Did start Sudafed. Difficult wearing his CPAP at night. Patient reports UTD with covid vaccines, flu shot.

## 2022-06-01 ENCOUNTER — Encounter: Payer: Self-pay | Admitting: Podiatry

## 2022-06-01 ENCOUNTER — Ambulatory Visit (INDEPENDENT_AMBULATORY_CARE_PROVIDER_SITE_OTHER): Payer: Medicare Other

## 2022-06-01 ENCOUNTER — Ambulatory Visit (INDEPENDENT_AMBULATORY_CARE_PROVIDER_SITE_OTHER): Payer: Medicare Other | Admitting: Podiatry

## 2022-06-01 DIAGNOSIS — M6740 Ganglion, unspecified site: Secondary | ICD-10-CM | POA: Diagnosis not present

## 2022-06-01 DIAGNOSIS — L84 Corns and callosities: Secondary | ICD-10-CM

## 2022-06-08 ENCOUNTER — Ambulatory Visit (INDEPENDENT_AMBULATORY_CARE_PROVIDER_SITE_OTHER): Payer: Medicare Other | Admitting: Podiatry

## 2022-06-08 ENCOUNTER — Encounter: Payer: Self-pay | Admitting: Podiatry

## 2022-06-08 DIAGNOSIS — M6740 Ganglion, unspecified site: Secondary | ICD-10-CM

## 2022-06-08 NOTE — Progress Notes (Signed)
  Subjective:  Patient ID: Eric Gentry, male    DOB: [birth year],   MRN: [id number]  No chief complaint on file.   66 y.o. male presents for follow-up of left third digit ganglion cyst aspiration. Relates doing well. Denies any pain or any concerns.   He is not diabetic.  Marland Kitchen Denies any other pedal complaints. Denies n/v/f/c.   Past Medical History:  Diagnosis Date   Chronic kidney disease    STAGE 3   Colon polyp 2009   Erectile dysfunction    Fatigue    GERD (gastroesophageal reflux disease)    Hypertension    Secondary thrombocytopenia    Sleep apnea     Objective:  Physical Exam: Vascular: DP/PT pulses 2/4 bilateral. CFT <3 seconds. Normal hair growth on digits. No edema.  Skin. No lacerations or abrasions bilateral feet. Left third digit area well healed. No signs of infection noted.  No tenderness to palpation. Some ecchymosis noted in the area around injection site.  Musculoskeletal: MMT 5/5 bilateral lower extremities in DF, PF, Inversion and Eversion. Deceased ROM in DF of ankle joint.  Neurological: Sensation intact to light touch.   Assessment:   1. Mucoid cyst of joint      Plan:  Patient was evaluated and treated and all questions answered. X-rays reviewed and discussed with patient. No acute fractures or dislocations.  Discussed ganglion cysts and treatment options with the patient. Cyst area well healed and no signs of infection.  Patient to follow-up as needed in future for possible aspiration vs surgical removal.    Louann Sjogren, DPM

## 2022-07-11 IMAGING — DX DG FOOT COMPLETE 3+V*L*
3 series · 3 of 3 positions shown · non-contrast
Comparison: None Available.

CLINICAL DATA: Anterior callus of the third toe for years.

EXAM:
LEFT FOOT - COMPLETE 3+ VIEW

[foot ap wb]
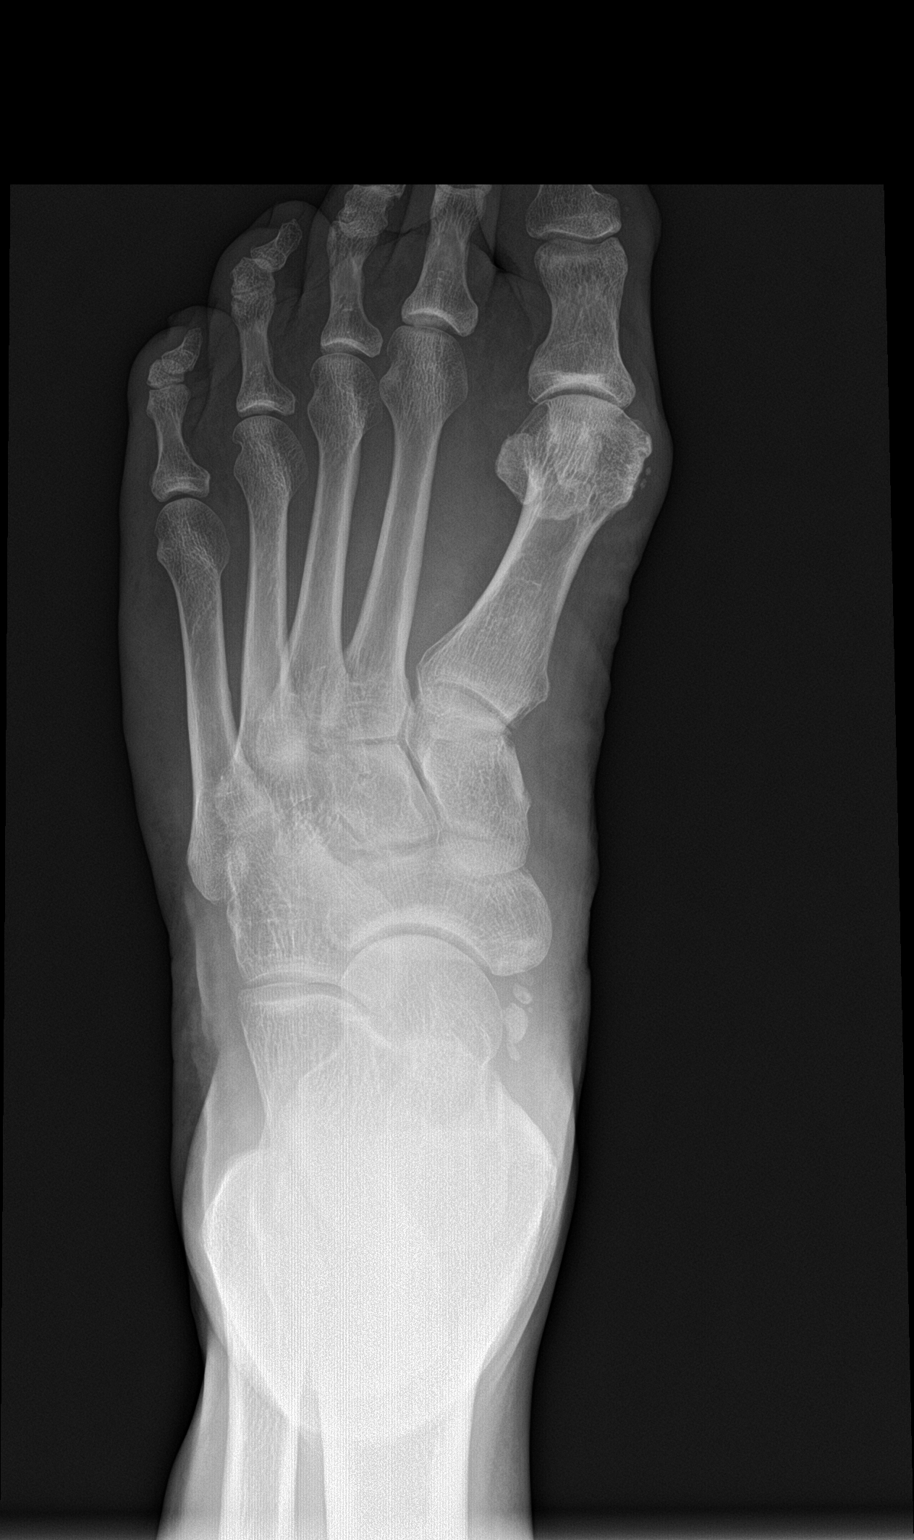

[foot obl wb]
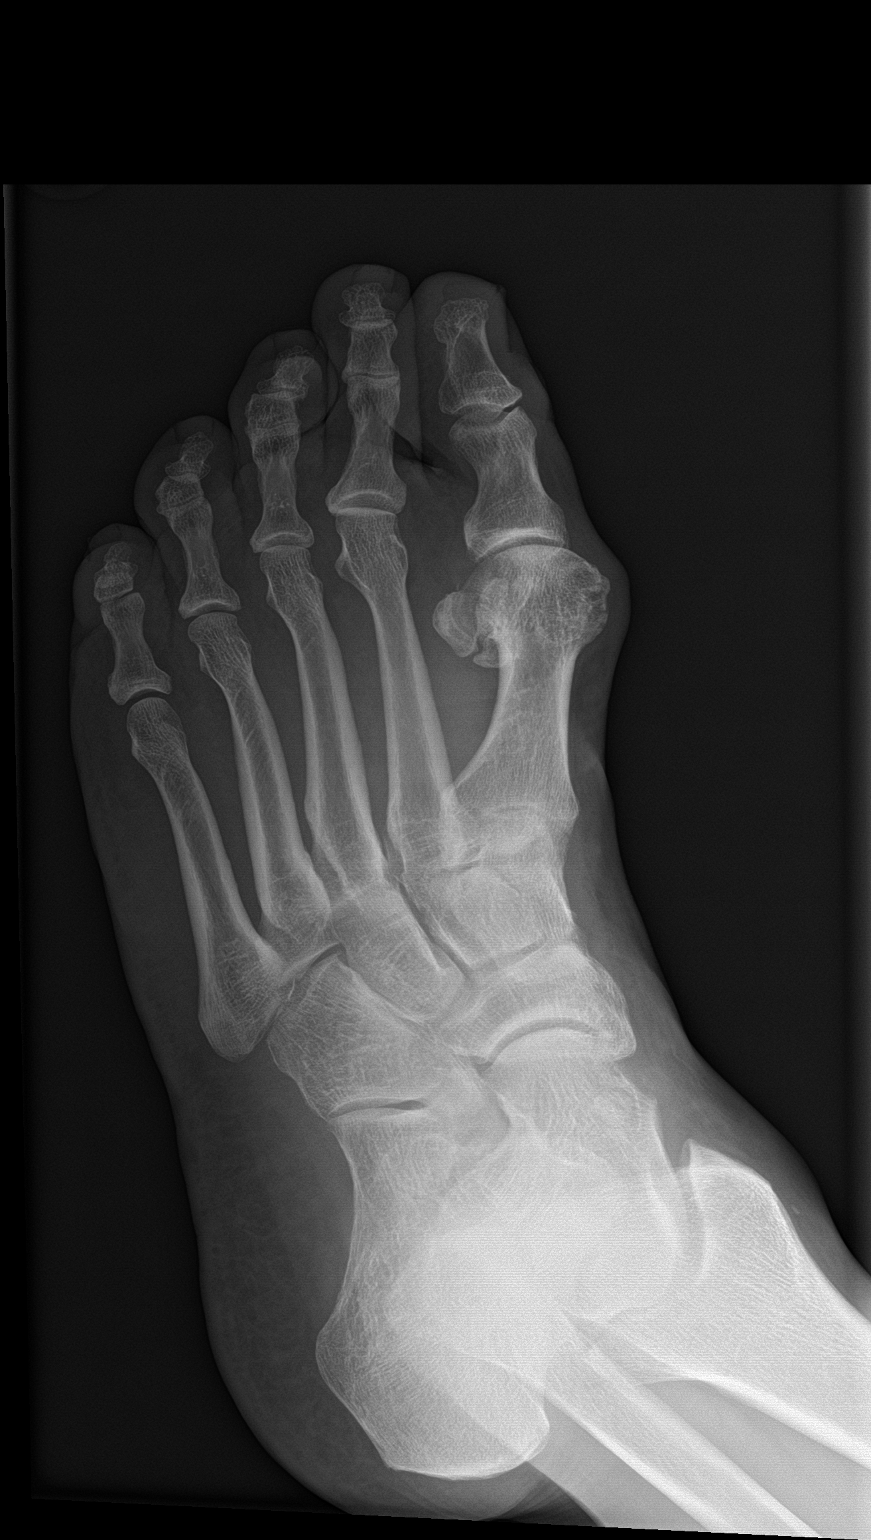

[foot lat wb]
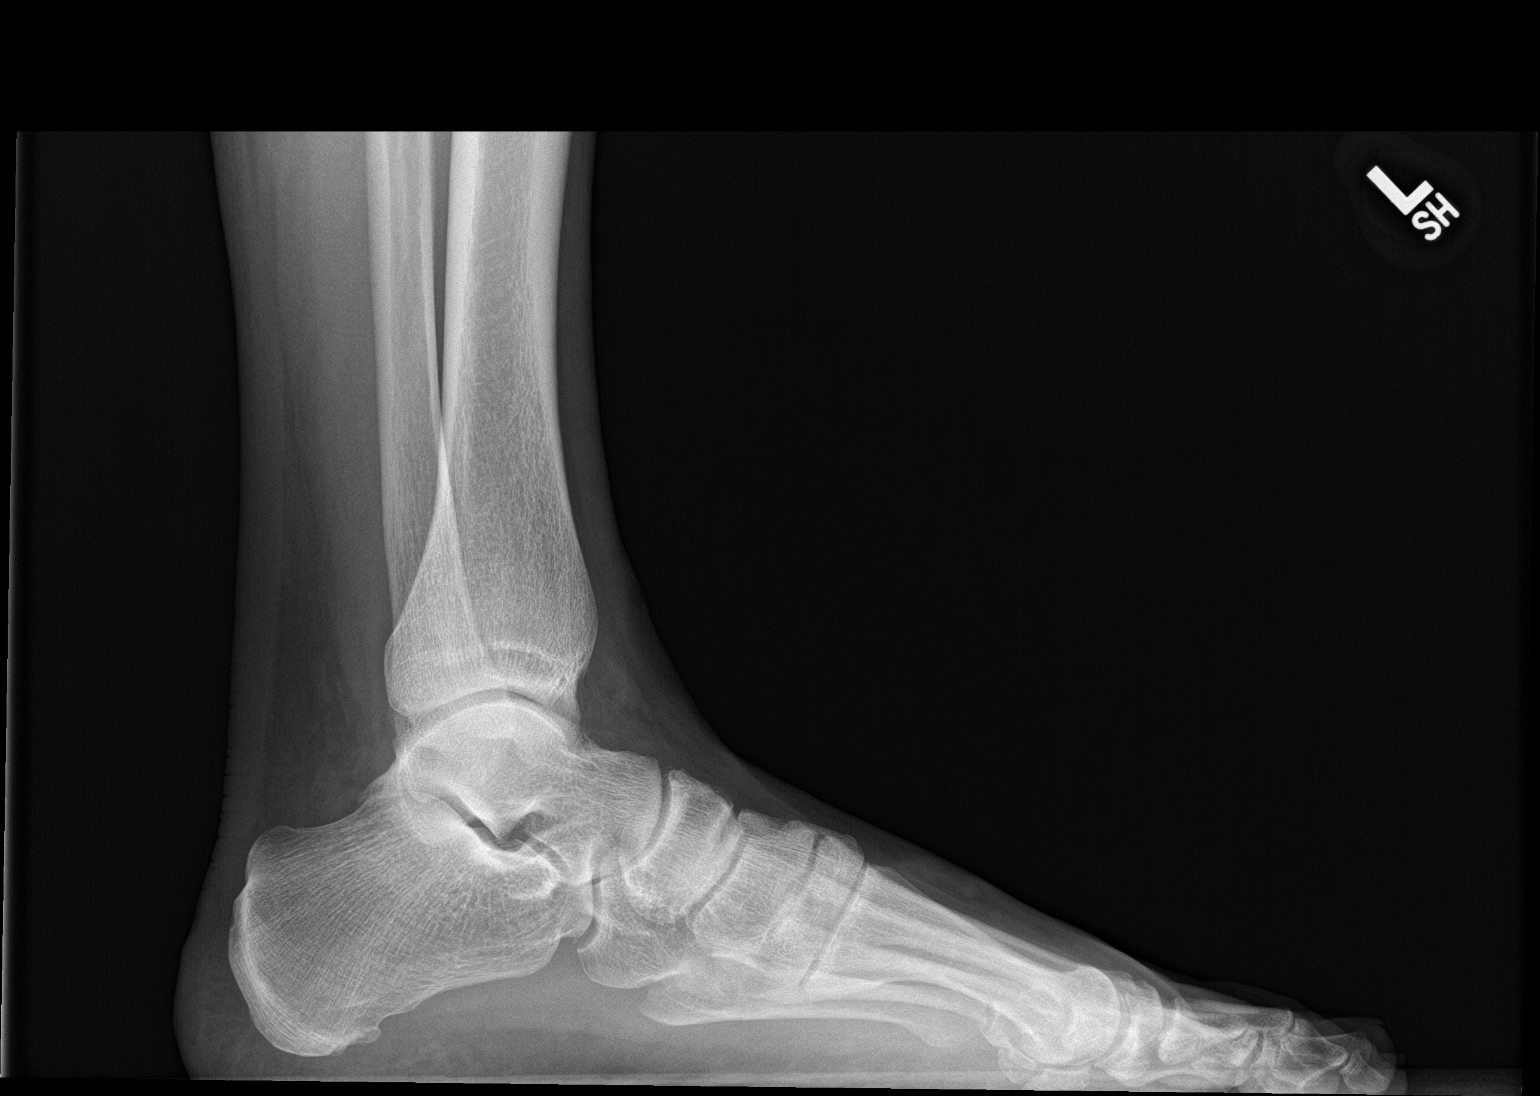

[3 of 3 positions shown; findings below may reference images not displayed]

FINDINGS: There is no evidence of fracture or dislocation. Mild degenerative
joint changes of the first metatarsal and metatarsal phalangeal
joint noted. Soft tissues are unremarkable.
IMPRESSION: No acute fracture or dislocation. Mild degenerative joint changes of
the first metatarsal and metatarsal phalangeal joint noted. Soft
tissues are unremarkable.

## 2023-09-04 ENCOUNTER — Other Ambulatory Visit (HOSPITAL_COMMUNITY): Payer: Self-pay | Admitting: Internal Medicine

## 2023-09-04 DIAGNOSIS — E7800 Pure hypercholesterolemia, unspecified: Secondary | ICD-10-CM

## 2023-09-10 ENCOUNTER — Ambulatory Visit (INDEPENDENT_AMBULATORY_CARE_PROVIDER_SITE_OTHER): Payer: Self-pay

## 2023-09-10 DIAGNOSIS — E7800 Pure hypercholesterolemia, unspecified: Secondary | ICD-10-CM
